# Patient Record
Sex: Female | Born: 1990 | Race: White | Hispanic: No
Health system: Southern US, Community
[De-identification: ages and names within clinical notes are randomized; demographics above are authoritative.]

---

## 2021-04-03 ENCOUNTER — Encounter: Payer: Self-pay | Admitting: Emergency Medicine

## 2021-04-03 ENCOUNTER — Other Ambulatory Visit: Payer: Self-pay

## 2021-04-03 ENCOUNTER — Emergency Department: Payer: Managed Care, Other (non HMO)

## 2021-04-03 ENCOUNTER — Emergency Department
Admission: EM | Admit: 2021-04-03 | Discharge: 2021-04-03 | Disposition: A | Discharge status: Home / Self Care | Payer: Managed Care, Other (non HMO) | Attending: Emergency Medicine | Admitting: Emergency Medicine

## 2021-04-03 DIAGNOSIS — R0789 Other chest pain: Secondary | ICD-10-CM | POA: Insufficient documentation

## 2021-04-03 DIAGNOSIS — G43709 Chronic migraine without aura, not intractable, without status migrainosus: Secondary | ICD-10-CM | POA: Diagnosis not present

## 2021-04-03 DIAGNOSIS — R079 Chest pain, unspecified: Secondary | ICD-10-CM

## 2021-04-03 LAB — CBC
HCT: 42 % (ref 36.0–46.0)
Hemoglobin: 14.1 g/dL (ref 12.0–15.0)
MCH: 32.4 pg (ref 26.0–34.0)
MCHC: 33.6 g/dL (ref 30.0–36.0)
MCV: 96.6 fL (ref 80.0–100.0)
Platelets: 210 10*3/uL (ref 150–400)
RBC: 4.35 MIL/uL (ref 3.87–5.11)
RDW: 11.8 % (ref 11.5–15.5)
WBC: 7.9 10*3/uL (ref 4.0–10.5)
nRBC: 0 % (ref 0.0–0.2)

## 2021-04-03 LAB — BASIC METABOLIC PANEL
Anion gap: 9 (ref 5–15)
BUN: 16 mg/dL (ref 6–20)
CO2: 24 mmol/L (ref 22–32)
Calcium: 9.6 mg/dL (ref 8.9–10.3)
Chloride: 104 mmol/L (ref 98–111)
Creatinine, Ser: 0.82 mg/dL (ref 0.44–1.00)
GFR, Estimated: >60 mL/min (ref >60)
Glucose, Bld: 90 mg/dL (ref 70–99)
Potassium: 4 mmol/L (ref 3.5–5.1)
Sodium: 137 mmol/L (ref 135–145)

## 2021-04-03 LAB — TROPONIN I (HIGH SENSITIVITY)
Troponin I (High Sensitivity): <2 ng/L (ref <18)
Troponin I (High Sensitivity): <2 ng/L (ref <18)

## 2021-04-03 LAB — D-DIMER, QUANTITATIVE: D-Dimer, Quant: 0.32 ug/mL-FEU (ref 0.00–0.50)

## 2021-04-03 MED ORDER — ONDANSETRON 4 MG PO TBDP: 4.0000 mg | ORAL_TABLET | Freq: Three times a day (TID) | ORAL | 0 refills | Status: AC | PRN

## 2021-04-03 MED ORDER — ALUM & MAG HYDROXIDE-SIMETH 200-200-20 MG/5ML PO SUSP
30.0000 mL | Freq: Once | ORAL | Status: AC
  Administered 2021-04-03: 30 mL via ORAL
  Filled 2021-04-03: qty 30

## 2021-04-03 MED ORDER — METOCLOPRAMIDE HCL 5 MG/ML IJ SOLN
10.0000 mg | Freq: Once | INTRAMUSCULAR | Status: AC
  Administered 2021-04-03: 10 mg via INTRAVENOUS
  Filled 2021-04-03: qty 2

## 2021-04-03 MED ORDER — LIDOCAINE VISCOUS HCL 2 % MT SOLN
15.0000 mL | Freq: Once | OROMUCOSAL | Status: AC
  Administered 2021-04-03: 15 mL via ORAL
  Filled 2021-04-03: qty 15

## 2021-04-03 MED ORDER — KETOROLAC TROMETHAMINE 10 MG PO TABS: 10.0000 mg | ORAL_TABLET | Freq: Three times a day (TID) | ORAL | 0 refills | Status: AC

## 2021-04-03 MED ORDER — DIPHENHYDRAMINE HCL 50 MG/ML IJ SOLN
25.0000 mg | Freq: Once | INTRAMUSCULAR | Status: AC
  Administered 2021-04-03: 25 mg via INTRAVENOUS
  Filled 2021-04-03: qty 1

## 2021-04-03 MED ORDER — KETOROLAC TROMETHAMINE 30 MG/ML IJ SOLN
30.0000 mg | Freq: Once | INTRAMUSCULAR | Status: AC
  Administered 2021-04-03: 30 mg via INTRAVENOUS
  Filled 2021-04-03: qty 1

## 2021-04-03 NOTE — ED Provider Notes (Addendum)
[facility] Emergency Department Provider Note  ____________________________________________   Event Date/Time   First MD Initiated Contact with Patient 04/03/21 1720     (approximate)  I have reviewed the triage vital signs and the nursing notes.   HISTORY  Chief Complaint Chest Pain  HPI Carrie Alexander is a 30 y.o. female with no significant medical history, presents herself to the ED for evaluation of sharp central chest pain that began about 7:00 this morning.  Patient describes the pain is continued upon movement.  Pain is present when she gets up and moves around.  She denies any cough, congestion, or shortness of breath.  She also denies any diaphoresis, nausea, vomiting, or referral of pain.  She denies any current pain on presentation.  History reviewed. No pertinent past medical history.  There are no problems to display for this patient.   History reviewed. No pertinent surgical history.  Prior to Admission medications   Medication Sig Start Date End Date Taking? Authorizing Provider  ketorolac (TORADOL) 10 MG tablet Take 1 tablet (10 mg total) by mouth every 8 (eight) hours. 04/03/21  Yes Leo Fray, Charlesetta Ivory, PA-C  ondansetron (ZOFRAN ODT) 4 MG disintegrating tablet Take 1 tablet (4 mg total) by mouth every 8 (eight) hours as needed. 04/03/21  Yes Ashya Nicolaisen, Charlesetta Ivory, PA-C    Allergies Patient has no allergy information on record.  History reviewed. No pertinent family history.  Social History    Review of Systems  Constitutional: No fever/chills Eyes: No visual changes. ENT: No sore throat. Cardiovascular: Reports chest pain as above. Respiratory: Denies shortness of breath. Gastrointestinal: No abdominal pain.  No nausea, no vomiting.  No diarrhea.  No constipation. Genitourinary: Negative for dysuria. Musculoskeletal: Negative for back pain. Skin: Negative for rash. Neurological: Negative for headaches, focal weakness or  numbness. ___________________________________________   PHYSICAL EXAM:  VITAL SIGNS: ED Triage Vitals  Enc Vitals Group     BP 04/03/21 1524 135/80     Pulse Rate 04/03/21 1524 (!) 104     Resp 04/03/21 1524 18     Temp 04/03/21 1524 98.1 F (36.7 C)     Temp Source 04/03/21 1524 Oral     SpO2 04/03/21 1524 99 %     Weight 04/03/21 1524 289 lb (131.1 kg)     Height 04/03/21 1524 5\' 6"  (1.676 m)     Head Circumference --      Peak Flow --      Pain Score 04/03/21 1529 0     Pain Loc --      Pain Edu? --      Excl. in GC? --     Constitutional: Alert and oriented. Well appearing and in no acute distress. Eyes: Conjunctivae are normal. PERRL. EOMI. Head: Atraumatic. Nose: No congestion/rhinnorhea. Mouth/Throat: Mucous membranes are moist.  Oropharynx non-erythematous. Neck: No stridor.   Cardiovascular: Normal rate, regular rhythm. Grossly normal heart sounds.  Good peripheral circulation.  Normal distal pulses.  Superficial varicose veins noted to the lateral right calf. Respiratory: Normal respiratory effort.  No retractions. Lungs CTAB.  Reproducible central sternal chest wall pain on palpation. Gastrointestinal: Soft and nontender. No distention. No abdominal bruits. No CVA tenderness. Musculoskeletal: Normal spinal alignment without midline tenderness, spasm, deformity, or step-off.  No lower extremity tenderness nor edema.  No joint effusions. Neurologic:  Normal speech and language. No gross focal neurologic deficits are appreciated. No gait instability. Skin:  Skin is warm, dry and intact.  No rash noted. Psychiatric: Mood and affect are normal. Speech and behavior are normal.  ____________________________________________   LABS (all labs ordered are listed, but only abnormal results are displayed)  Labs Reviewed  BASIC METABOLIC PANEL  CBC  D-DIMER, QUANTITATIVE  TROPONIN I (HIGH SENSITIVITY)  TROPONIN I (HIGH SENSITIVITY)    ____________________________________________  EKG  Vent. rate 100 BPM PR interval 142 ms QRS duration 90 ms QT/QTcB 354/456 ms P-R-T axes 49 46 30 No STEMI ____________________________________________  RADIOLOGY I, Lissa Hoard, personally viewed and evaluated these images (plain radiographs) as part of my medical decision making, as well as reviewing the written report by the radiologist.  ED MD interpretation:  Agree with report  Official radiology report(s): No results found.  ____________________________________________   PROCEDURES  Procedure(s) performed (including Critical Care):  Procedures  Benadryl 25 mg IVP Metoclopramide 10 mg IVP Ketorolac 30 mg IVP GI cocktail plus lidocaine 2% p.o. ____________________________________________   INITIAL IMPRESSION / ASSESSMENT AND PLAN / ED COURSE  As part of my medical decision making, I reviewed the following data within the electronic MEDICAL RECORD NUMBER Labs reviewed WNL, EKG interpreted NSR, Radiograph reviewed NAD and Notes from prior ED visits   Differential diagnosis includes, but is not limited to, ACS, aortic dissection, pulmonary embolism, cardiac tamponade, pneumothorax, pneumonia, pericarditis, myocarditis, GI-related causes including esophagitis/gastritis, and musculoskeletal chest wall pain.    Patient ED evaluation of central chest wall tenderness with reproducible component on exam.  Patient was evaluated for complaints in the ED.  She was PERC negative, and a Wells score of less than 2.  She was evaluated chest x-ray and EKG which were all normal and reassuring at this time.  Troponin is negative x2 and a d-dimer was normal and reassuring.  As such to patient was treated for musculoskeletal etiology with IV doses of anti-inflammatories and muscle relaxants.  She also endorses headache pain, and was given Reglan and Benadryl cocktail along with the oral GI cocktail.  She is discharged at the time in  stable condition with no acute complaints.  Patient is encouraged to follow-up with primary provider for ongoing symptoms.  Return to the ED if necessary. ____________________________________________   FINAL CLINICAL IMPRESSION(S) / ED DIAGNOSES  Final diagnoses:  Nonspecific chest pain  Chronic migraine without aura without status migrainosus, not intractable     ED Discharge Orders         Ordered    ketorolac (TORADOL) 10 MG tablet  Every 8 hours        04/03/21 1955    ondansetron (ZOFRAN ODT) 4 MG disintegrating tablet  Every 8 hours PRN        04/03/21 1955          *Please note:  Carrie Alexander was evaluated in Emergency Department on 04/04/2021 for the symptoms described in the history of present illness. She was evaluated in the context of the global COVID-19 pandemic, which necessitated consideration that the patient might be at risk for infection with the SARS-CoV-2 virus that causes COVID-19. Institutional protocols and algorithms that pertain to the evaluation of patients at risk for COVID-19 are in a state of rapid change based on information released by regulatory bodies including the CDC and federal and state organizations. These policies and algorithms were followed during the patient's care in the ED.  Some ED evaluations and interventions may be delayed as a result of limited staffing during and the pandemic.*   Note:  This document was prepared using  [employer] and may include unintentional dictation errors.    Lissa Hoard, PA-C 04/04/21 1808    Lissa Hoard, PA-C 04/04/21 1810    Shaune Pollack, MD 04/09/21 [phone]

## 2021-04-03 NOTE — ED Triage Notes (Signed)
Pt reports this am at 0700 started with sharp cp top her left chest when she gets up and moves around. Pt denies SOB or pain that radiates or nausea.

## 2021-04-03 NOTE — ED Provider Notes (Signed)
EKG: Normal sinus rhythm, VR 100. PR 142, QRS 90, QTc 456. No acute St elevations or depressions. No ischemia or infarct.   Shaune Pollack, MD 04/03/21 [id number]

## 2021-04-03 NOTE — Discharge Instructions (Addendum)
Your exam, labs and chest x-ray are normal and reassuring at this time.  Your D-dimer and troponin are also reassuring as it showed indication of a PE and or acute heart attack, respectively.  His symptoms may be related to a musculoskeletal pain including costochondritis, or a GI source.  Take the medications as provided, and follow-up with your primary provider for ongoing symptoms peer return to ED if needed.

## 2021-04-03 NOTE — ED Provider Notes (Signed)
Emergency Ultrasound Study:   Angiocath insertion Performed by: Dollene Cleveland Consent: Verbal consent/emergent consent obtained. Risks and benefits: risks, benefits and alternatives were discussed Immediately prior to procedure the correct patient, procedure, equipment, support staff and site/side marked as needed.  Indication: difficult IV access Preparation: Patient was prepped and draped in the usual sterile fashion. Sterile gel was used for this procedure and the ultrasound probe was sterilized prior to use. Vein Location: Left AC vein was visualized during assessment for potential access sites and was found to be patent/ easily compressed with linear ultrasound.  The needle was visualized with real-time ultrasound and guided into the vein. Gauge: 20  Image saved and stored.  Normal blood return.   Patient tolerance: Patient tolerated the procedure well with no immediate complications.        Shaune Pollack, MD 04/03/21 [phone]

## 2021-04-03 NOTE — ED Notes (Signed)
MD to bedside for ultrasound IV.

## 2022-01-29 IMAGING — CR DG CHEST 2V
1 series · 2 of 2 positions shown · non-contrast
Comparison: 12/31/2019

CLINICAL DATA: Chest pain for several hours

EXAM:
CHEST - 2 VIEW

[Series 1: dg chest 2 view · 0.14mm/px · 2 of 2 slices shown]
[im 1/2]
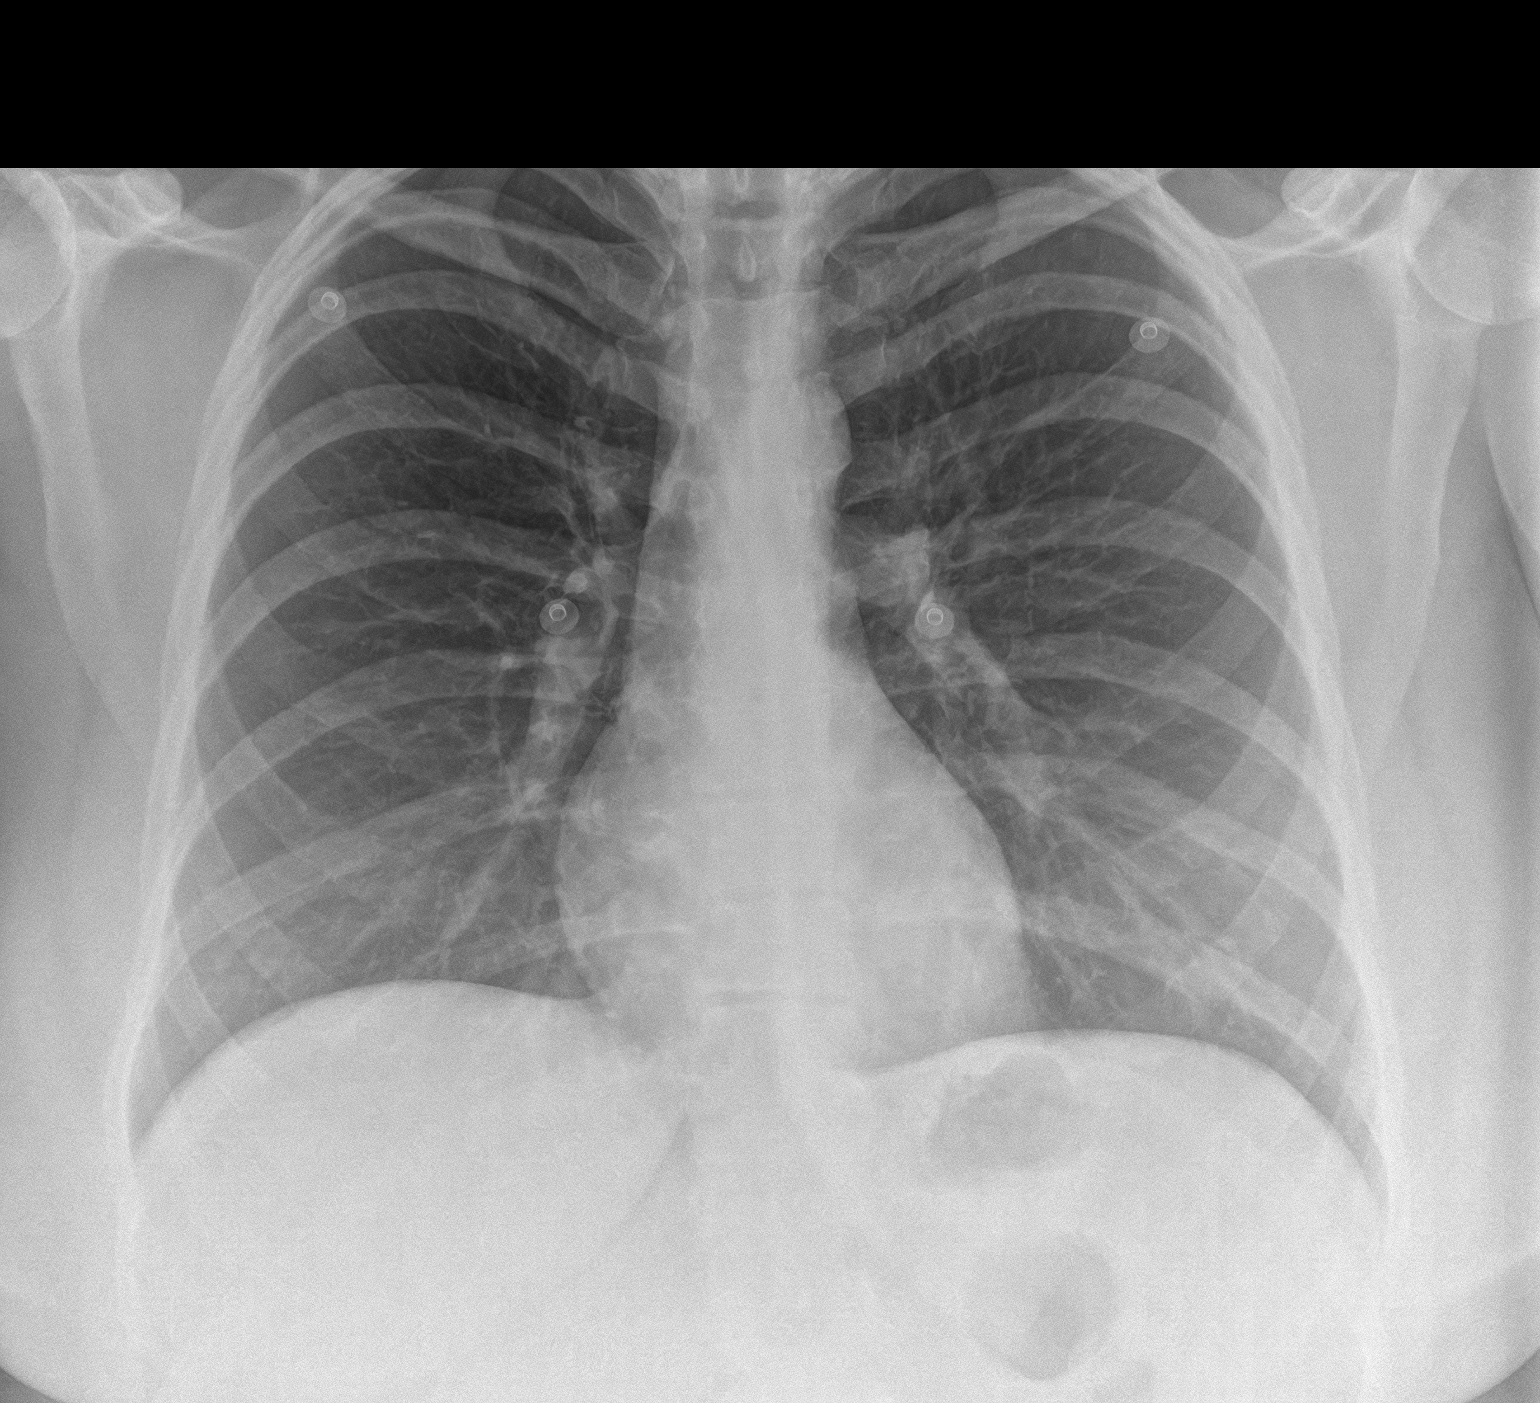
[im 2/2]
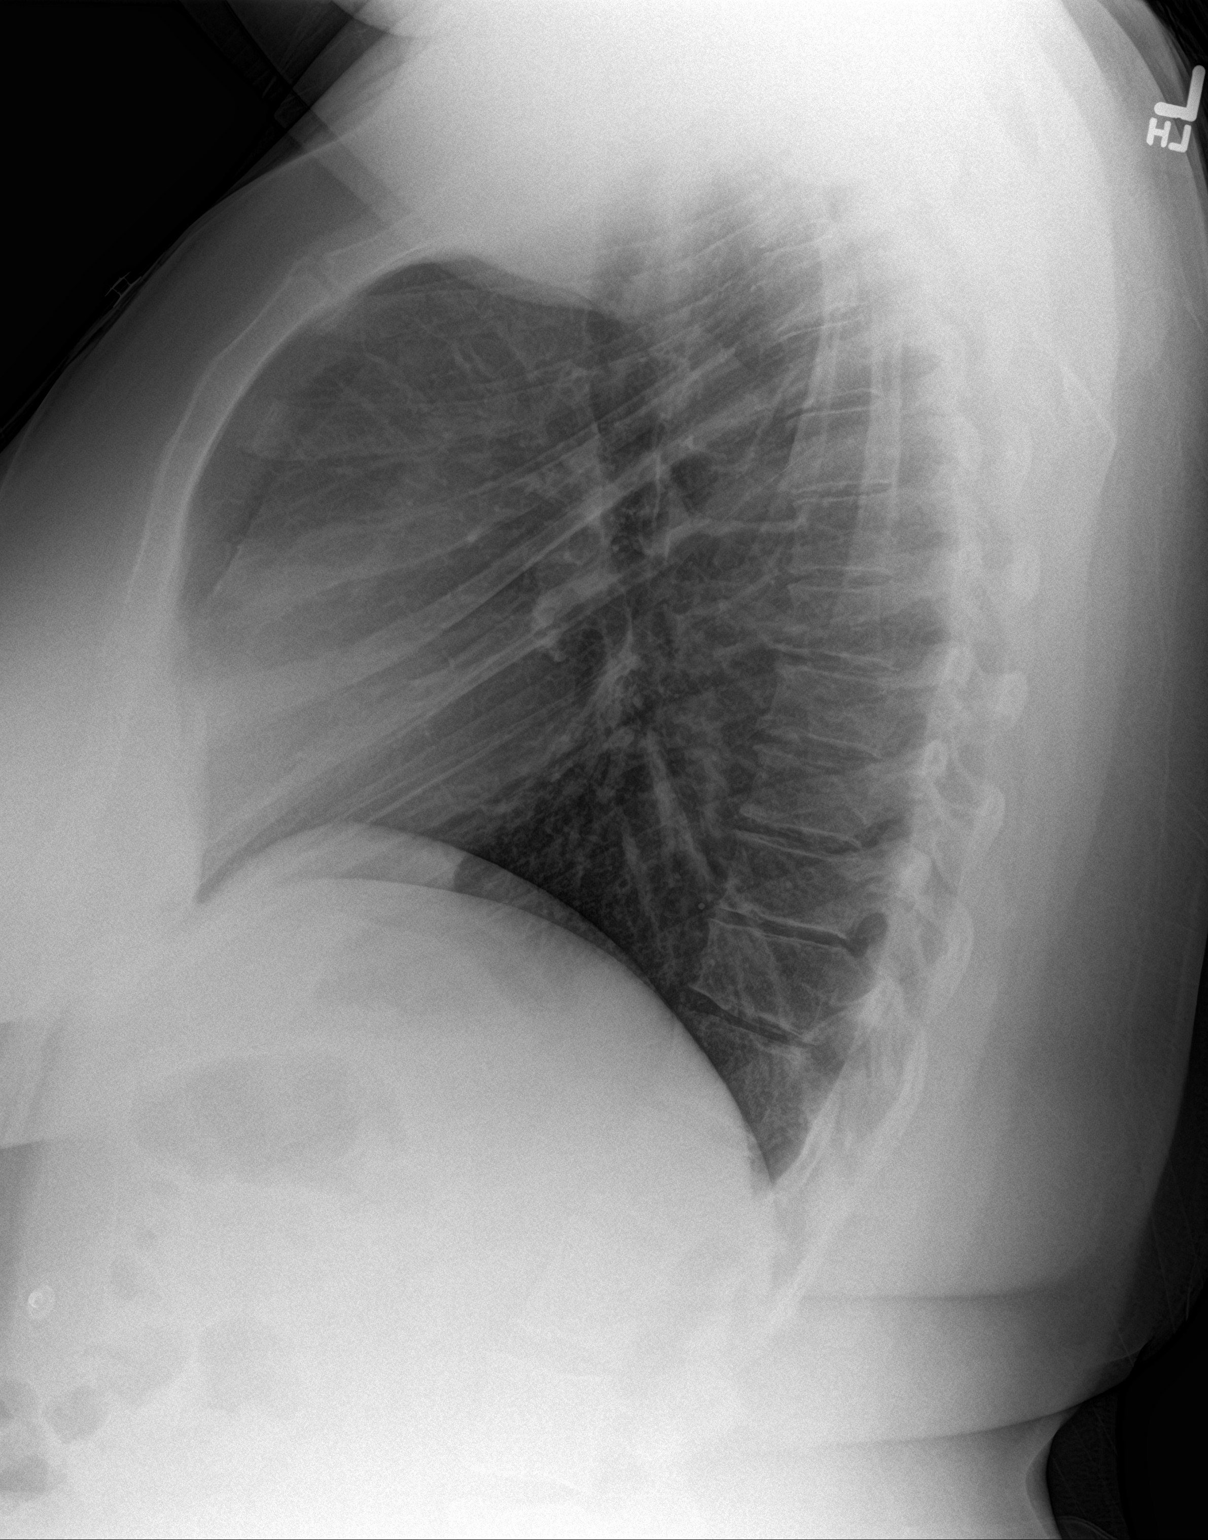

[2 of 2 positions shown; findings below may reference images not displayed]

FINDINGS: The heart size and mediastinal contours are within normal limits.
Both lungs are clear. The visualized skeletal structures are
unremarkable.
IMPRESSION: No active cardiopulmonary disease.
